# Patient Record
Sex: Male | Born: 1995 | Race: White | Hispanic: No | Marital: Single | Smoking: Current every day smoker
Health system: Southern US, Community
[De-identification: ages and names within clinical notes are randomized; demographics above are authoritative.]

## PROBLEM LIST (undated history)

## (undated) DIAGNOSIS — F909 Attention-deficit hyperactivity disorder, unspecified type: Secondary | ICD-10-CM

## (undated) DIAGNOSIS — J45909 Unspecified asthma, uncomplicated: Secondary | ICD-10-CM

## (undated) HISTORY — PX: TONSILLECTOMY: SUR1361

## (undated) HISTORY — PX: FOOT FRACTURE SURGERY: SHX645

## (undated) HISTORY — PX: ADENOIDECTOMY: SUR15

---
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
## 2015-03-11 ENCOUNTER — Encounter (HOSPITAL_COMMUNITY): Payer: Self-pay | Admitting: Emergency Medicine

## 2015-03-11 ENCOUNTER — Emergency Department (HOSPITAL_COMMUNITY)
Admission: EM | Admit: 2015-03-11 | Discharge: 2015-03-11 | Disposition: A | Discharge status: Home / Self Care | Payer: Medicaid Other | Attending: Emergency Medicine | Admitting: Emergency Medicine

## 2015-03-11 DIAGNOSIS — Z720 Tobacco use: Secondary | ICD-10-CM | POA: Insufficient documentation

## 2015-03-11 DIAGNOSIS — R0789 Other chest pain: Secondary | ICD-10-CM

## 2015-03-11 DIAGNOSIS — J45909 Unspecified asthma, uncomplicated: Secondary | ICD-10-CM | POA: Diagnosis not present

## 2015-03-11 DIAGNOSIS — Z8659 Personal history of other mental and behavioral disorders: Secondary | ICD-10-CM | POA: Diagnosis not present

## 2015-03-11 DIAGNOSIS — Y9389 Activity, other specified: Secondary | ICD-10-CM | POA: Diagnosis not present

## 2015-03-11 DIAGNOSIS — X58XXXA Exposure to other specified factors, initial encounter: Secondary | ICD-10-CM | POA: Diagnosis not present

## 2015-03-11 DIAGNOSIS — Y998 Other external cause status: Secondary | ICD-10-CM | POA: Diagnosis not present

## 2015-03-11 DIAGNOSIS — S29011A Strain of muscle and tendon of front wall of thorax, initial encounter: Secondary | ICD-10-CM | POA: Insufficient documentation

## 2015-03-11 DIAGNOSIS — Y9289 Other specified places as the place of occurrence of the external cause: Secondary | ICD-10-CM | POA: Insufficient documentation

## 2015-03-11 HISTORY — DX: Attention-deficit hyperactivity disorder, unspecified type: F90.9

## 2015-03-11 HISTORY — DX: Unspecified asthma, uncomplicated: J45.909

## 2015-03-11 MED ORDER — NAPROXEN 500 MG PO TABS: 500.0000 mg | ORAL_TABLET | Freq: Two times a day (BID) | ORAL | Status: DC

## 2015-03-11 MED ORDER — BENZONATATE 100 MG PO CAPS: 100.0000 mg | ORAL_CAPSULE | Freq: Three times a day (TID) | ORAL | Status: DC

## 2015-03-11 NOTE — ED Notes (Signed)
PT c/o nasal congestion with productive yellow sputum cough and tightness with breathing at times x3 days. PT states no relief from OTC medications at home and denies any fever.

## 2015-03-11 NOTE — Discharge Instructions (Signed)
Chest Wall Pain °Chest wall pain is pain felt in or around the chest bones and muscles. It may take up to 6 weeks to get better. It may take longer if you are active. Chest wall pain can happen on its own. Other times, things like germs, injury, coughing, or exercise can cause the pain. °HOME CARE  °· Avoid activities that make you tired or cause pain. Try not to use your chest, belly (abdominal), or side muscles. Do not use heavy weights. °· Put ice on the sore area. °¨ Put ice in a plastic bag. °¨ Place a towel between your skin and the bag. °¨ Leave the ice on for 15-20 minutes for the first 2 days. °· Only take medicine as told by your doctor. °GET HELP RIGHT AWAY IF:  °· You have more pain or are very uncomfortable. °· You have a fever. °· Your chest pain gets worse. °· You have new problems. °· You feel sick to your stomach (nauseous) or throw up (vomit). °· You start to sweat or feel lightheaded. °· You have a cough with mucus (phlegm). °· You cough up blood. °MAKE SURE YOU:  °· Understand these instructions. °· Will watch your condition. °· Will get help right away if you are not doing well or get worse. °Document Released: 11/27/2007 Document Revised: 09/02/2011 Document Reviewed: 02/04/2011 °ExitCare® Patient Information ©2015 [employer]. This information is not intended to replace advice given to you by your health care provider. Make sure you discuss any questions you have with your health care provider. ° °

## 2015-03-11 NOTE — ED Provider Notes (Signed)
CSN: [id number]     Arrival date & time 03/11/15  1034 History  This chart was scribed for Rolland Porter, MD by Ronney Lion, ED Scribe. This patient was seen in room APA08/[facility]08 and the patient's care was started at 10:49 AM.    Chief Complaint  Patient presents with  . Cough   The history is provided by the patient. No language interpreter was used.    HPI Comments: Brian Wheeler is a 19 y.o. male who presents to the Emergency Department complaining of constant, moderate, tight chest wall pain that began 4 days ago. He also notes an associated moderate cough with wheezing that feels like asthma. He notes some nasal congestion and rhinorrhea but attributes that to seasonal allergies. Patient states this pain feels similar to the time he pulled a muscle in his chest before, but the pain is worse with this episode. Palpation, coughing, and heavy lifting all exacerbate his pain.   Past Medical History  Diagnosis Date  . Asthma     as a child  . ADHD (attention deficit hyperactivity disorder)    Past Surgical History  Procedure Laterality Date  . Tonsillectomy    . Foot fracture surgery      left  . Adenoidectomy     History reviewed. No pertinent family history. Social History  Substance Use Topics  . Smoking status: Current Every Day Smoker -- 1.00 packs/day    Types: Cigarettes  . Smokeless tobacco: None  . Alcohol Use: Yes     Comment: rarely    Review of Systems  Constitutional: Negative for fever, chills, diaphoresis, appetite change and fatigue.  HENT: Negative for mouth sores, sore throat and trouble swallowing.   Eyes: Negative for visual disturbance.  Respiratory: Positive for cough. Negative for chest tightness, shortness of breath and wheezing.   Cardiovascular: Positive for chest pain (chest wall pain).  Gastrointestinal: Negative for nausea, vomiting, abdominal pain, diarrhea and abdominal distention.  Endocrine: Negative for polydipsia, polyphagia and polyuria.   Genitourinary: Negative for dysuria, frequency and hematuria.  Musculoskeletal: Negative for gait problem.  Skin: Negative for color change, pallor and rash.  Neurological: Negative for dizziness, syncope, light-headedness and headaches.  Hematological: Does not bruise/bleed easily.  Psychiatric/Behavioral: Negative for behavioral problems and confusion.      Allergies  Review of patient's allergies indicates no known allergies.  Home Medications   Prior to Admission medications   Medication Sig Start Date End Date Taking? Authorizing Provider  benzonatate (TESSALON) 100 MG capsule Take 1 capsule (100 mg total) by mouth every 8 (eight) hours. 03/11/15   Rolland Porter, MD  naproxen (NAPROSYN) 500 MG tablet Take 1 tablet (500 mg total) by mouth 2 (two) times daily. 03/11/15   Rolland Porter, MD   There were no vitals taken for this visit. Physical Exam  Constitutional: He is oriented to person, place, and time. He appears well-developed and well-nourished. No distress.  HENT:  Head: Normocephalic.  Eyes: Conjunctivae are normal. Pupils are equal, round, and reactive to light. No scleral icterus.  Neck: Normal range of motion. Neck supple. No thyromegaly present.  Cardiovascular: Normal rate and regular rhythm.  Exam reveals no gallop and no friction rub.   No murmur heard. Pulmonary/Chest: Effort normal and breath sounds normal. No respiratory distress. He has no wheezes. He has no rales.  Abdominal: Soft. Bowel sounds are normal. He exhibits no distension. There is no tenderness. There is no rebound.  Musculoskeletal: Normal range of motion. He exhibits  tenderness.  Tender along pectoralis.  Neurological: He is alert and oriented to person, place, and time.  Skin: Skin is warm and dry. No rash noted.  Psychiatric: He has a normal mood and affect. His behavior is normal.  Nursing note and vitals reviewed.   ED Course  Procedures (including critical care time)  COORDINATION OF  CARE: 10:53 AM - Suspect pain is musculoskeletal in etiology. Discussed treatment plan with pt at bedside which includes anti-inflammatory and cough medication. Pt verbalized understanding and agreed to plan.   MDM   Final diagnoses:  Chest wall pain  Pectoralis muscle strain, initial encounter    I personally performed the services described in this documentation, which was scribed in my presence. The recorded information has been reviewed and is accurate.    Rolland Porter, MD 03/11/15 [phone]

## 2015-03-11 NOTE — ED Notes (Signed)
Patient with no complaints at this time. Respirations even and unlabored. Skin warm/dry. Discharge instructions reviewed with patient at this time. Patient given opportunity to voice concerns/ask questions. Patient discharged at this time and left Emergency Department with steady gait.   

## 2015-05-24 ENCOUNTER — Encounter (HOSPITAL_COMMUNITY): Payer: Self-pay | Admitting: Emergency Medicine

## 2015-05-24 ENCOUNTER — Emergency Department (HOSPITAL_COMMUNITY): Payer: Medicaid Other

## 2015-05-24 ENCOUNTER — Emergency Department (HOSPITAL_COMMUNITY)
Admission: EM | Admit: 2015-05-24 | Discharge: 2015-05-24 | Disposition: A | Discharge status: Home / Self Care | Payer: Medicaid Other | Attending: Emergency Medicine | Admitting: Emergency Medicine

## 2015-05-24 DIAGNOSIS — W01198A Fall on same level from slipping, tripping and stumbling with subsequent striking against other object, initial encounter: Secondary | ICD-10-CM | POA: Diagnosis not present

## 2015-05-24 DIAGNOSIS — F17210 Nicotine dependence, cigarettes, uncomplicated: Secondary | ICD-10-CM | POA: Diagnosis not present

## 2015-05-24 DIAGNOSIS — Y998 Other external cause status: Secondary | ICD-10-CM | POA: Diagnosis not present

## 2015-05-24 DIAGNOSIS — Y9289 Other specified places as the place of occurrence of the external cause: Secondary | ICD-10-CM | POA: Insufficient documentation

## 2015-05-24 DIAGNOSIS — J45909 Unspecified asthma, uncomplicated: Secondary | ICD-10-CM | POA: Diagnosis not present

## 2015-05-24 DIAGNOSIS — S0083XA Contusion of other part of head, initial encounter: Secondary | ICD-10-CM | POA: Insufficient documentation

## 2015-05-24 DIAGNOSIS — Y9372 Activity, wrestling: Secondary | ICD-10-CM | POA: Insufficient documentation

## 2015-05-24 DIAGNOSIS — S161XXA Strain of muscle, fascia and tendon at neck level, initial encounter: Secondary | ICD-10-CM | POA: Insufficient documentation

## 2015-05-24 DIAGNOSIS — S0993XA Unspecified injury of face, initial encounter: Secondary | ICD-10-CM | POA: Diagnosis present

## 2015-05-24 MED ORDER — CYCLOBENZAPRINE HCL 10 MG PO TABS: 10.0000 mg | ORAL_TABLET | Freq: Two times a day (BID) | ORAL | Status: DC | PRN

## 2015-05-24 MED ORDER — NAPROXEN 500 MG PO TABS: 500.0000 mg | ORAL_TABLET | Freq: Two times a day (BID) | ORAL | Status: DC

## 2015-05-24 MED ORDER — NAPROXEN 250 MG PO TABS
500.0000 mg | ORAL_TABLET | Freq: Once | ORAL | Status: AC
  Administered 2015-05-24: 500 mg via ORAL
  Filled 2015-05-24: qty 2
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
## 2016-07-13 ENCOUNTER — Encounter (HOSPITAL_COMMUNITY): Payer: Self-pay | Admitting: Emergency Medicine

## 2016-07-13 ENCOUNTER — Emergency Department (HOSPITAL_COMMUNITY): Payer: Medicaid Other

## 2016-07-13 ENCOUNTER — Emergency Department (HOSPITAL_COMMUNITY)
Admission: EM | Admit: 2016-07-13 | Discharge: 2016-07-13 | Disposition: A | Discharge status: Home / Self Care | Payer: Medicaid Other | Attending: Emergency Medicine | Admitting: Emergency Medicine

## 2016-07-13 DIAGNOSIS — R509 Fever, unspecified: Secondary | ICD-10-CM | POA: Diagnosis present

## 2016-07-13 DIAGNOSIS — M542 Cervicalgia: Secondary | ICD-10-CM | POA: Insufficient documentation

## 2016-07-13 DIAGNOSIS — M549 Dorsalgia, unspecified: Secondary | ICD-10-CM | POA: Diagnosis not present

## 2016-07-13 DIAGNOSIS — J069 Acute upper respiratory infection, unspecified: Secondary | ICD-10-CM

## 2016-07-13 DIAGNOSIS — J45909 Unspecified asthma, uncomplicated: Secondary | ICD-10-CM | POA: Insufficient documentation

## 2016-07-13 DIAGNOSIS — F909 Attention-deficit hyperactivity disorder, unspecified type: Secondary | ICD-10-CM | POA: Diagnosis not present

## 2016-07-13 DIAGNOSIS — F17210 Nicotine dependence, cigarettes, uncomplicated: Secondary | ICD-10-CM | POA: Diagnosis not present

## 2016-07-13 MED ORDER — IPRATROPIUM-ALBUTEROL 0.5-2.5 (3) MG/3ML IN SOLN
3.0000 mL | Freq: Once | RESPIRATORY_TRACT | Status: AC
  Administered 2016-07-13: 3 mL via RESPIRATORY_TRACT
  Filled 2016-07-13: qty 3

## 2016-07-13 MED ORDER — BENZONATATE 100 MG PO CAPS: 100.0000 mg | ORAL_CAPSULE | Freq: Three times a day (TID) | ORAL | 0 refills | Status: AC

## 2016-07-13 MED ORDER — ALBUTEROL SULFATE HFA 108 (90 BASE) MCG/ACT IN AERS: 1.0000 | INHALATION_SPRAY | Freq: Four times a day (QID) | RESPIRATORY_TRACT | 0 refills | Status: AC | PRN

## 2016-07-13 MED ORDER — TRAMADOL HCL 50 MG PO TABS
50.0000 mg | ORAL_TABLET | Freq: Once | ORAL | Status: AC
  Administered 2016-07-13: 50 mg via ORAL
  Filled 2016-07-13: qty 1

## 2016-07-13 MED ORDER — PREDNISONE 10 MG PO TABS: 20.0000 mg | ORAL_TABLET | Freq: Every day | ORAL | 0 refills | Status: AC

## 2016-07-13 NOTE — ED Provider Notes (Signed)
[facility] DEPT Provider Note   CSN: [id number] Arrival date & time: 07/13/16  0810     History   Chief Complaint Chief Complaint  Patient presents with  . Fever    HPI Brian Wheeler is a 21 y.o. male with PMHx of asthma as a child presenting to the ED for complaints of cough, sore throat, and back pain for 10 days. Patient states that his cough is productive with yellowish and greenish sputum, that is mildly improving, and worse in the morning when he wakes up. Patient also complains of associated neck pain, wheezing, sore throat, chills, subjective fevers, SOB secondary to cough, and back pain. He rates his sore throat as 7/10 pain.  Patient states he normally has low back pain due to his job, but states that when these symptoms started his back pain became more intense. He describes his back pain as achy, and a 9/10 at this time. Patient admits to taking ibuprofen everyday for his subjective fevers and chills, theraflu which helped a little initially and has not noticed any difference recently, and alka seltzer with little to no relief. He states overall the first week was unchanged but has noticed an overall mild improvement of his symptoms the last few days but does comes in today concerned with the amount of time it has lingered. Patient has history of asthma as a child but has not had any symptoms since then. No history of COPD. Patient does smoke half a pack of cigarettes every day. Patient has history of tonsillectomy. Patient denies nuchal rigidity, no trouble breathing, no trouble swallowing, nausea, vomiting, chest pain, abdominal pain, changes in urination, changes in bowel movements, recent contacts.   The history is provided by the patient. No language interpreter was used.  Fever   Associated symptoms include sore throat. Pertinent negatives include no chest pain, no diarrhea and no vomiting.    Past Medical History:  Diagnosis Date  . ADHD (attention deficit  hyperactivity disorder)   . Asthma    as a child    There are no active problems to display for this patient.   Past Surgical History:  Procedure Laterality Date  . ADENOIDECTOMY    . FOOT FRACTURE SURGERY     left  . TONSILLECTOMY         Home Medications    Prior to Admission medications   Medication Sig Start Date End Date Taking? Authorizing Provider  amphetamine-dextroamphetamine (ADDERALL XR) 20 MG 24 hr capsule Take 20 mg by mouth daily.   Yes Historical Provider, MD  albuterol (PROVENTIL HFA;VENTOLIN HFA) 108 (90 Base) MCG/ACT inhaler Inhale 1-2 puffs into the lungs every 6 (six) hours as needed for wheezing or shortness of breath. 07/13/16   Quenna Doepke Manuel [place], [place]  benzonatate (TESSALON) 100 MG capsule Take 1 capsule (100 mg total) by mouth every 8 (eight) hours. 07/13/16   Gretna Bergin Manuel [place], [place]  predniSONE (DELTASONE) 10 MG tablet Take 2 tablets (20 mg total) by mouth daily. 07/13/16 07/18/16  Alvina Chou, [place]    Family History History reviewed. No pertinent family history.  Social History Social History  Substance Use Topics  . Smoking status: Current Every Day Smoker    Packs/day: 0.50    Types: Cigarettes  . Smokeless tobacco: Never Used  . Alcohol use No     Comment: rarely     Allergies   Patient has no known allergies.   Review of Systems Review of Systems  Constitutional: Positive for  chills and fever (Subjective).  HENT: Positive for sore throat. Negative for trouble swallowing.   Eyes: Negative for visual disturbance.  Respiratory: Positive for shortness of breath (secondary to cough) and wheezing.   Cardiovascular: Negative for chest pain.  Gastrointestinal: Negative for abdominal pain, diarrhea, nausea and vomiting.  Genitourinary: Negative for difficulty urinating and dysuria.  Musculoskeletal: Positive for back pain and neck pain (Left side of posterior neck). Negative for gait problem and neck stiffness.  Skin:  Negative for rash and wound.  Neurological: Negative for speech difficulty, weakness and numbness.     Physical Exam Updated Vital Signs BP 139/89 (BP Location: Right Arm)   Pulse 93   Temp 98.9 F (37.2 C) (Oral)   Resp 16   Ht 6\' 4"  (1.93 m)   Wt 99.8 kg   SpO2 97%   BMI 26.78 kg/m   Physical Exam  Constitutional: He is oriented to person, place, and time. He appears well-developed and well-nourished.  Well appearing.   HENT:  Head: Normocephalic and atraumatic.  Nose: Nose normal.  Mouth/Throat: Oropharynx is clear and moist.  Eyes: Conjunctivae and EOM are normal. Pupils are equal, round, and reactive to light.  Neck: Normal range of motion. Neck supple.  Cardiovascular: Normal rate and normal heart sounds.   Pulmonary/Chest: Effort normal and breath sounds normal. No respiratory distress. He has no wheezes. He exhibits no tenderness.  No respiratory distress. No accessory muscles used. Handling secretions well.   Abdominal: Soft. Bowel sounds are normal. He exhibits no pulsatile midline mass. There is no tenderness. There is no rebound, no guarding, no tenderness at McBurney's point and negative Murphy's sign.  Musculoskeletal: Normal range of motion.  Neurological: He is alert and oriented to person, place, and time.  Cranial Nerves:  III,IV, VI: ptosis not present, extra-ocular movements intact bilaterally, direct and consensual pupillary light reflexes intact bilaterally V: facial sensation, jaw opening, and bite strength equal bilaterally VII: eyebrow raise, eyelid close, smile, frown, pucker equal bilaterally VIII: hearing grossly normal bilaterally  IX,X: palate elevation and swallowing intact XI: bilateral shoulder shrug and lateral head rotation equal and strong XII: midline tongue extension  Cerebellar tests negative.  Sensory intact.  Muscle strength 5/5 Patient able to ambulate without difficulty.   SLR Left + at 45 deg SLR Right + at 45 deg  Negative  brudzinski, Negative Kergnig sign  Skin: Skin is warm. Capillary refill takes less than 2 seconds. No rash noted.  Psychiatric: He has a normal mood and affect. His behavior is normal.  Nursing note and vitals reviewed.    ED Treatments / Results  Labs (all labs ordered are listed, but only abnormal results are displayed) Labs Reviewed - No data to display  EKG  EKG Interpretation None       Radiology Dg Chest 2 View  Result Date: 07/13/2016 CLINICAL DATA:  Cough, fever EXAM: CHEST  2 VIEW COMPARISON:  07/19/2009 chest radiograph. FINDINGS: Stable cardiomediastinal silhouette with normal heart size. No pneumothorax. No pleural effusion. Lungs appear clear, with no acute consolidative airspace disease and no pulmonary edema. IMPRESSION: No active cardiopulmonary disease. Electronically Signed   By: Delbert Phenix M.D.   On: 07/13/2016 09:53    Procedures Procedures (including critical care time)  Medications Ordered in ED Medications  ipratropium-albuterol (DUONEB) 0.5-2.5 (3) MG/3ML nebulizer solution 3 mL (3 mLs Nebulization Given 07/13/16 1118)  traMADol (ULTRAM) tablet 50 mg (50 mg Oral Given 07/13/16 1021)     Initial Impression /  Assessment and Plan / ED Course  I have reviewed the triage vital signs and the nursing notes.  Pertinent labs & imaging results that were available during my care of the patient were reviewed by me and considered in my medical decision making (see chart for details).     Pt is a 21 yo male presents with cough, sore throat, and back pain for 10 days . On exam, pt in NAD. VSS. No hypoxia. Afebrile. Lungs clear, Heart sounds clear. Normal work of breathing. Throat benign. Abdomen nontender/soft. Pt CXR negative for acute infiltrate. Patient given nebulizer treatment and pain medication for back pain. Patients symptoms are consistent with URI, likely viral etiology. Discussed that antibiotics are not indicated for viral infections.   No neurological  deficits appreciated. Patient is ambulatory. No warning symptoms of back pain including: fecal incontinence, urinary retention or overflow incontinence, night sweats, waking from sleep with back pain, unexplained fevers or weight loss, h/o cancer, IVDU, recent trauma. No concern for cauda equina, epidural abscess, or other serious cause of back pain. Conservative measures such as rest, ice/heat and pain medicine indicated with PCP follow-up if no improvement with conservative management. Pt will be discharged with symptomatic treatment.  Verbalizes understanding and is agreeable with plan. Pt is hemodynamically stable & in NAD prior to dc.  Final Clinical Impressions(s) / ED Diagnoses   Final diagnoses:  Upper respiratory tract infection, unspecified type    New Prescriptions Discharge Medication List as of 07/13/2016 11:19 AM    START taking these medications   Details  albuterol (PROVENTIL HFA;VENTOLIN HFA) 108 (90 Base) MCG/ACT inhaler Inhale 1-2 puffs into the lungs every 6 (six) hours as needed for wheezing or shortness of breath., Starting Sat 07/13/2016, Print    benzonatate (TESSALON) 100 MG capsule Take 1 capsule (100 mg total) by mouth every 8 (eight) hours., Starting Sat 07/13/2016, Print    predniSONE (DELTASONE) 10 MG tablet Take 2 tablets (20 mg total) by mouth daily., Starting Sat 07/13/2016, Until Thu 07/18/2016, Print         [address]Avarey Yaeger [place], [place] 07/13/16 1709    Cathren LaineKevin Steinl, MD 07/14/16 1009

## 2016-07-13 NOTE — ED Triage Notes (Signed)
Cough symptoms for last 10 days.  C/o cough(yellowish), sore throat 7/10 pain, chills, back pain 9/10.  OTC meds have not helped.

## 2016-07-13 NOTE — Discharge Instructions (Signed)
Please follow up with your primary care physician for today's visit in 2-5 days. Please use albuterol inhaler for wheezing or shortness of breath as needed. Please take 2 tablets of prednisone everyday for 5 days. Please use Tessalon perles as needed to help prevent cough. Take 1 teaspoon of honey 3 times a day for throat and cough. Perform warm saltwater rinses several times a day for throat relief.   Get help right away if: You have severe or persistent: Headache. Ear pain. Sinus pain. Chest pain. You have chronic lung disease and any of the following: Wheezing. Prolonged cough. Coughing up blood. A change in your usual mucus. You have a stiff neck. You have changes in your: Vision. Hearing. Thinking. Mood.

## 2016-07-13 NOTE — ED Notes (Signed)
Went to update vitals Dr in with pt

## 2016-08-22 ENCOUNTER — Ambulatory Visit (HOSPITAL_COMMUNITY)
Admission: RE | Admit: 2016-08-22 | Discharge: 2016-08-22 | Disposition: A | Discharge status: Home / Self Care | Payer: Medicaid Other | Source: Ambulatory Visit | Attending: Family Medicine | Admitting: Family Medicine

## 2016-08-22 ENCOUNTER — Other Ambulatory Visit (HOSPITAL_COMMUNITY): Payer: Self-pay | Admitting: Family Medicine

## 2016-08-22 DIAGNOSIS — M545 Low back pain: Secondary | ICD-10-CM | POA: Insufficient documentation

## 2016-08-22 DIAGNOSIS — R52 Pain, unspecified: Secondary | ICD-10-CM
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
